# Patient Record
Sex: Female | Born: 1980 | Race: White | Hispanic: No | Marital: Married | Smoking: Former smoker
Health system: Southern US, Community
[De-identification: ages and names within clinical notes are randomized; demographics above are authoritative.]

---
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
## 2002-03-02 ENCOUNTER — Other Ambulatory Visit: Admission: RE | Admit: 2002-03-02 | Discharge: 2002-03-02 | Payer: Self-pay | Admitting: Obstetrics & Gynecology

## 2003-10-03 ENCOUNTER — Ambulatory Visit (HOSPITAL_COMMUNITY): Admission: RE | Admit: 2003-10-03 | Discharge: 2003-10-03 | Payer: Self-pay | Admitting: Obstetrics and Gynecology

## 2003-10-29 ENCOUNTER — Other Ambulatory Visit: Admission: RE | Admit: 2003-10-29 | Discharge: 2003-10-29 | Payer: Self-pay | Admitting: Obstetrics and Gynecology

## 2004-04-20 ENCOUNTER — Inpatient Hospital Stay (HOSPITAL_COMMUNITY): Admission: AD | Admit: 2004-04-20 | Discharge: 2004-04-20 | Payer: Self-pay | Admitting: Obstetrics and Gynecology

## 2004-04-26 ENCOUNTER — Inpatient Hospital Stay (HOSPITAL_COMMUNITY): Admission: AD | Admit: 2004-04-26 | Discharge: 2004-04-26 | Payer: Self-pay | Admitting: Obstetrics and Gynecology

## 2004-04-28 ENCOUNTER — Inpatient Hospital Stay (HOSPITAL_COMMUNITY): Admission: AD | Admit: 2004-04-28 | Discharge: 2004-04-28 | Payer: Self-pay | Admitting: Obstetrics and Gynecology

## 2004-05-19 ENCOUNTER — Inpatient Hospital Stay (HOSPITAL_COMMUNITY): Admission: AD | Admit: 2004-05-19 | Discharge: 2004-05-21 | Payer: Self-pay | Admitting: Obstetrics and Gynecology

## 2004-10-21 ENCOUNTER — Emergency Department (HOSPITAL_COMMUNITY): Admission: EM | Admit: 2004-10-21 | Discharge: 2004-10-21 | Payer: Self-pay | Admitting: Emergency Medicine

## 2005-02-24 ENCOUNTER — Other Ambulatory Visit: Admission: RE | Admit: 2005-02-24 | Discharge: 2005-02-24 | Payer: Self-pay | Admitting: Obstetrics and Gynecology

## 2005-06-29 ENCOUNTER — Inpatient Hospital Stay (HOSPITAL_COMMUNITY): Admission: AD | Admit: 2005-06-29 | Discharge: 2005-06-29 | Payer: Self-pay | Admitting: Obstetrics and Gynecology

## 2005-07-08 ENCOUNTER — Inpatient Hospital Stay (HOSPITAL_COMMUNITY): Admission: AD | Admit: 2005-07-08 | Discharge: 2005-07-08 | Payer: Self-pay | Admitting: Obstetrics and Gynecology

## 2005-07-08 ENCOUNTER — Inpatient Hospital Stay (HOSPITAL_COMMUNITY): Admission: AD | Admit: 2005-07-08 | Discharge: 2005-07-10 | Payer: Self-pay | Admitting: Obstetrics and Gynecology

## 2005-07-08 ENCOUNTER — Encounter (INDEPENDENT_AMBULATORY_CARE_PROVIDER_SITE_OTHER): Payer: Self-pay | Admitting: Specialist

## 2005-09-04 IMAGING — US US OB TRANSVAGINAL MODIFY
1 series · 18 of 21 positions shown · non-contrast
Comparison: none

CLINICAL DATA: Question blighted ovum.
EARLY OBSTETRICAL ULTRASOUND WITH TRANSVAGINAL
Multiple images of the uterus and adnexa were obtained using a transabdominal and endovaginal approaches. 
There is a single intrauterine pregnancy identified that demonstrates an estimated gestational age by crown rump length of 7 weeks and 5 days.  Positive regular fetal cardiac activity with a rate of 154 bpm was noted.  A normal appearing yolk sac is seen.  No evidence for subchorionic hemorrhage is noted.
There is a simple cyst identified in the right adnexa which measures 5.8 x 4.3 x 3.5 cm.  This likely represents an enlarged corpus luteum cyst, but follow-up evaluation at a later point in gestation would be recommended to assess for interval decrease in size or resolution.  While this is felt most likely to be ovarian clearly defined ovarian tissue surrounding this cyst cannot be identified.  No separate right ovary is seen and the left ovary was not visualized either transabdominally or endovaginally.  No pelvic fluid is seen.
IMPRESSION
7 week 5 day living intrauterine pregnancy.  Right adnexal simple cyst felt likely to an prominent corpus luteum cyst.  Follow-up at a later point in gestation is recommended for initial short term reassessment.

[Series 1: us ob transvaginal · 18 of 21 slices shown]
[im 1/21]
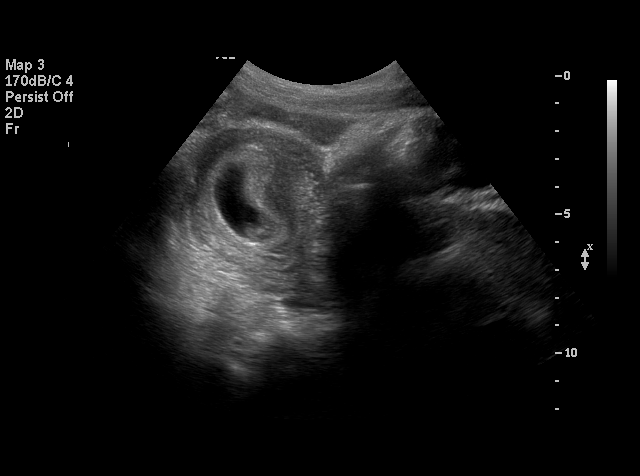
[im 2/21]
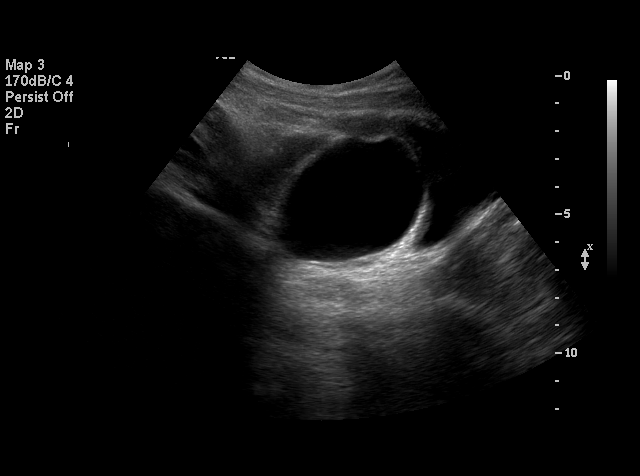
[im 3/21]
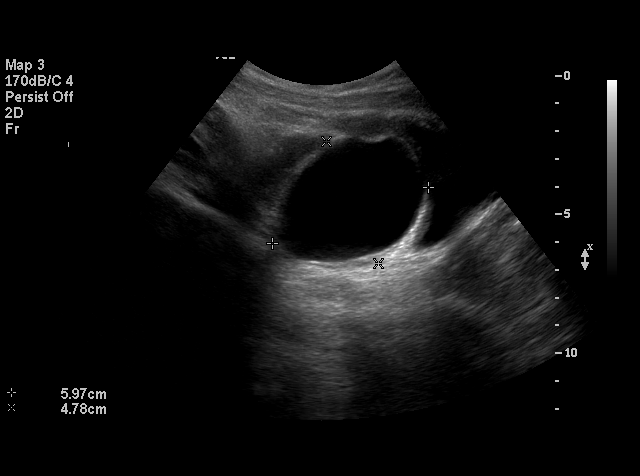
[im 5/21]
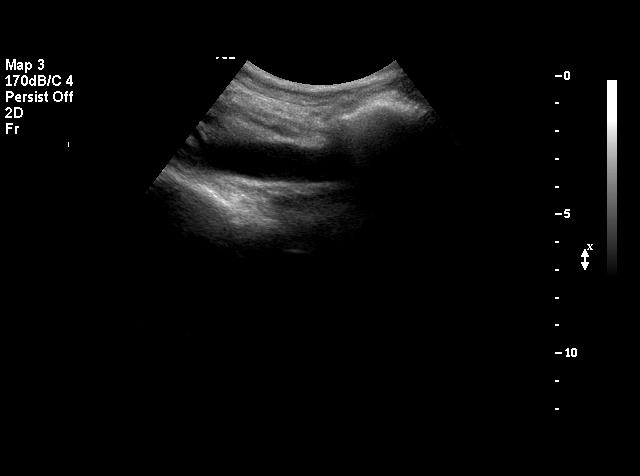
[im 6/21]
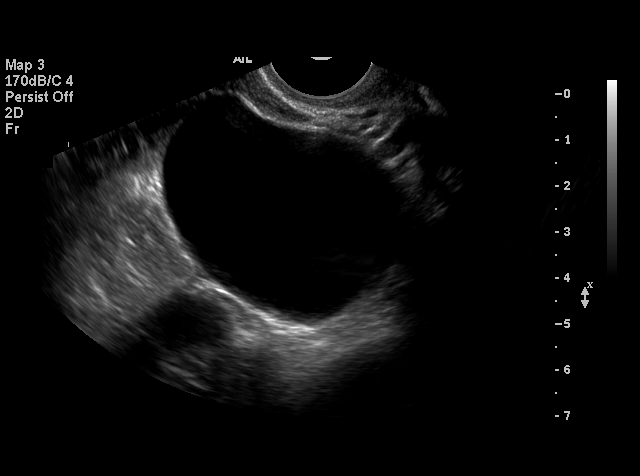
[im 7/21]
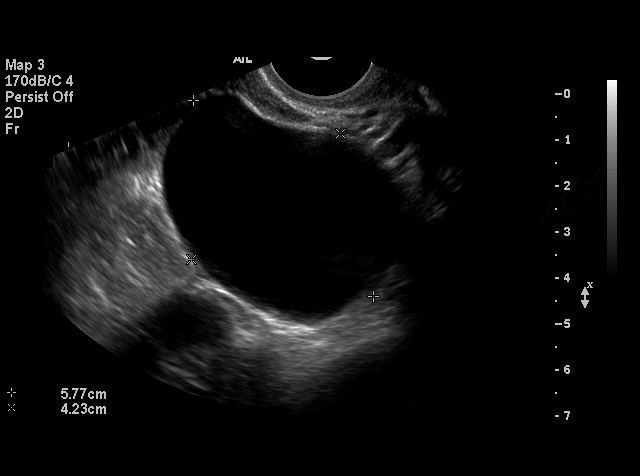
[im 8/21]
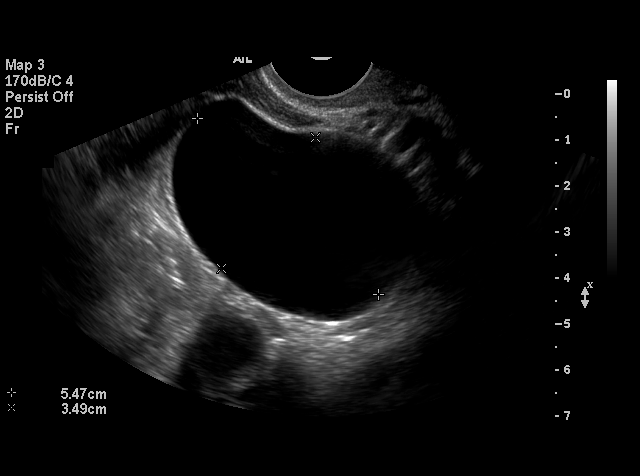
[im 9/21]
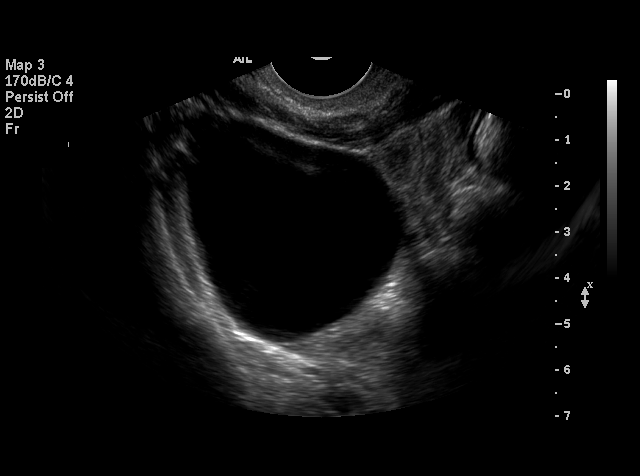
[im 10/21]
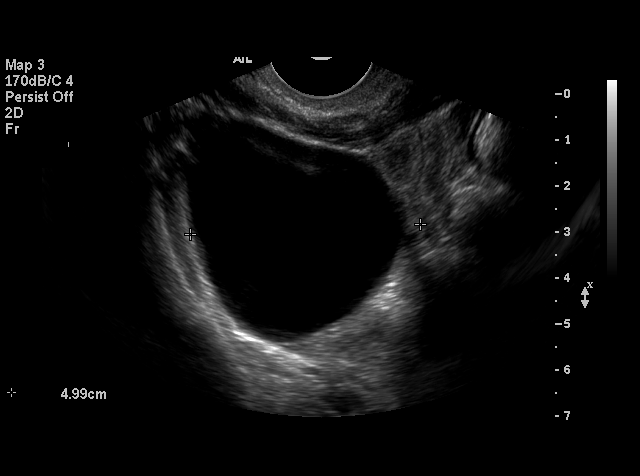
[im 12/21]
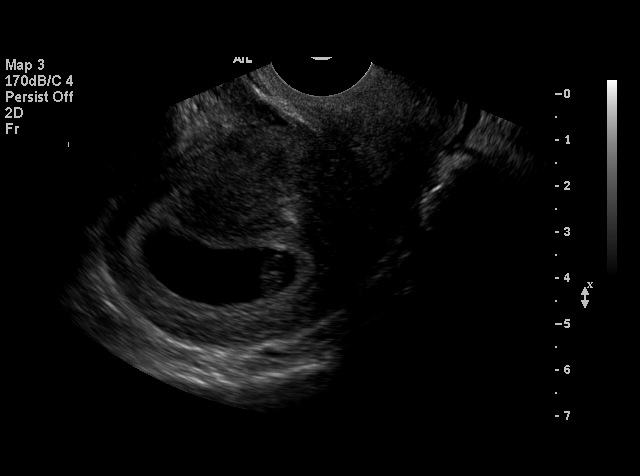
[im 13/21]
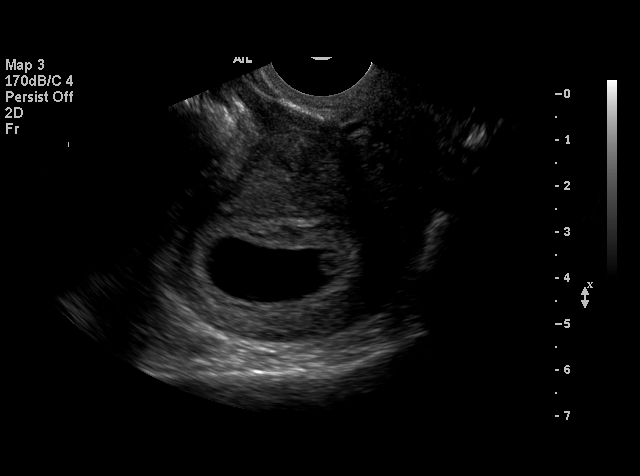
[im 14/21]
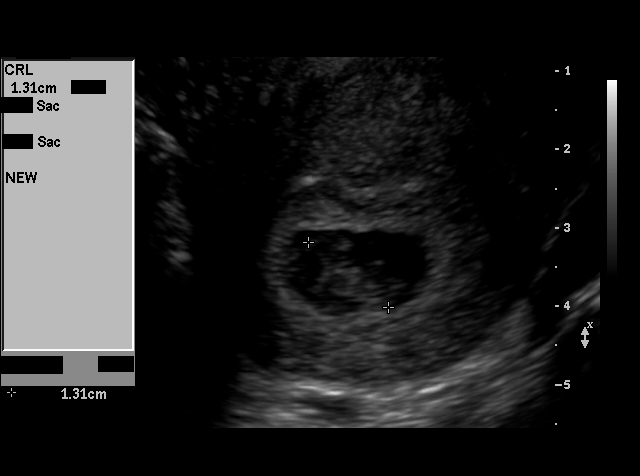
[im 15/21]
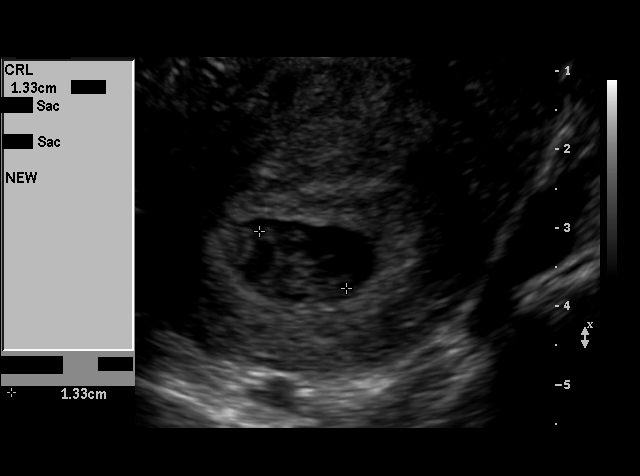
[im 16/21]
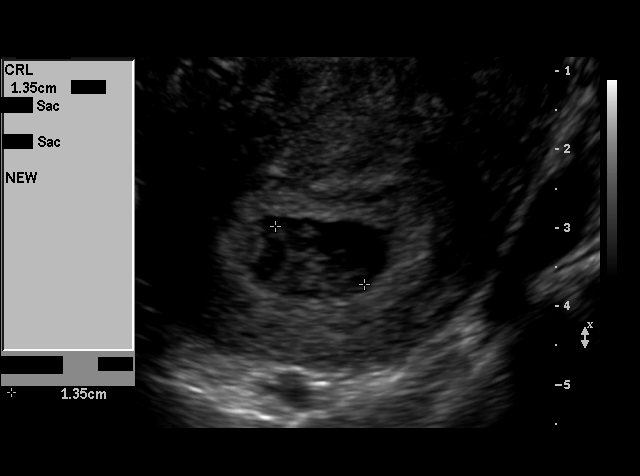
[im 17/21]
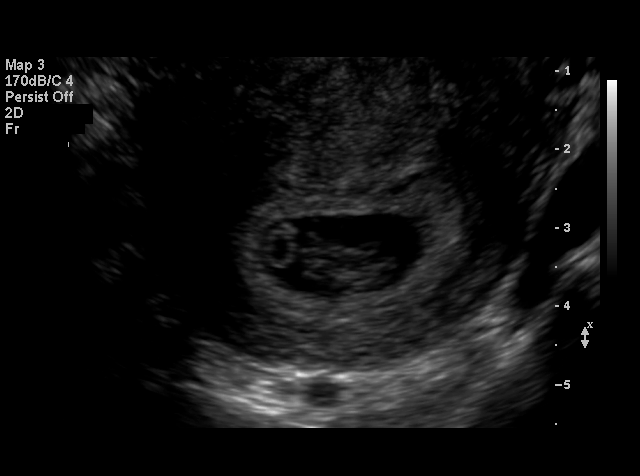
[im 19/21]
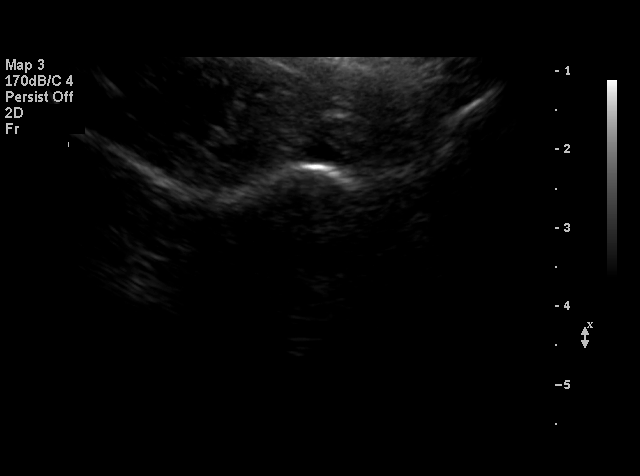
[im 20/21]
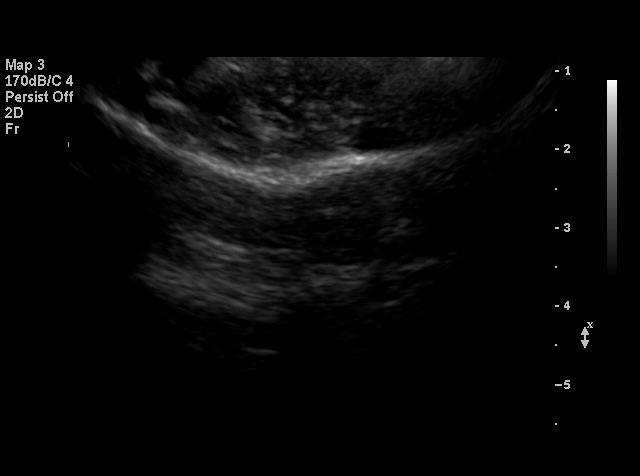
[im 21/21]
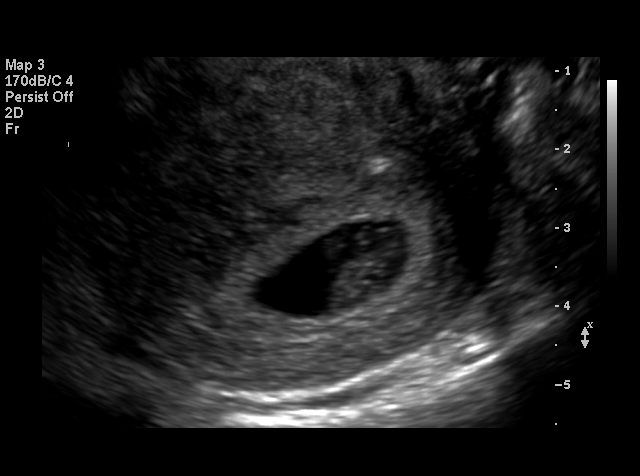

[18 of 21 positions shown; findings below may reference images not displayed]

## 2007-12-04 ENCOUNTER — Inpatient Hospital Stay (HOSPITAL_COMMUNITY): Admission: AD | Admit: 2007-12-04 | Discharge: 2007-12-05 | Payer: Self-pay | Admitting: Obstetrics and Gynecology

## 2007-12-05 ENCOUNTER — Inpatient Hospital Stay (HOSPITAL_COMMUNITY): Admission: AD | Admit: 2007-12-05 | Discharge: 2007-12-14 | Payer: Self-pay | Admitting: Obstetrics and Gynecology

## 2007-12-12 ENCOUNTER — Encounter (INDEPENDENT_AMBULATORY_CARE_PROVIDER_SITE_OTHER): Payer: Self-pay | Admitting: Obstetrics and Gynecology

## 2007-12-13 ENCOUNTER — Encounter (INDEPENDENT_AMBULATORY_CARE_PROVIDER_SITE_OTHER): Payer: Self-pay | Admitting: Obstetrics and Gynecology

## 2009-11-07 IMAGING — US US FETAL BPP W/O NONSTRESS
1 series · 14 of 20 positions shown · non-contrast
Comparison: none

OBSTETRICAL ULTRASOUND:
 This ultrasound exam was performed in the [HOSPITAL] Ultrasound Department.  The OB US report was generated in the [facility] system, and faxed to the ordering physician.  This report is also available in [REDACTED] PACS.

[Series 1: us fetal bpp w/o nonstress · 0.28mm/px · 14 of 20 slices shown]
[im 1/20]
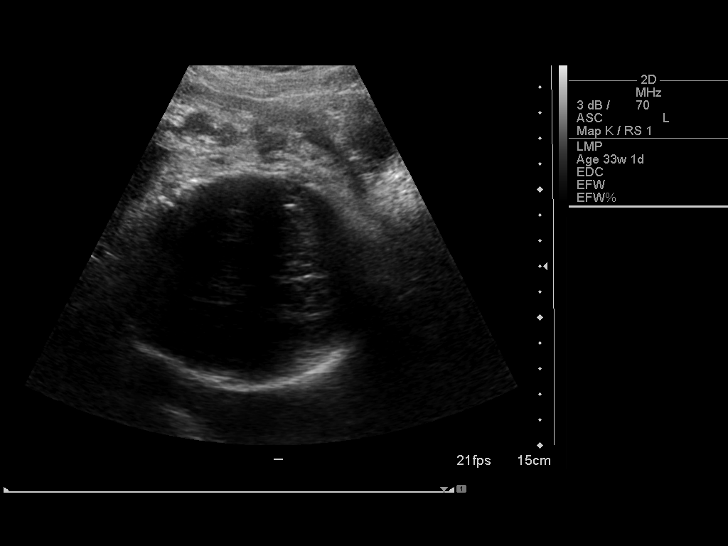
[im 3/20]
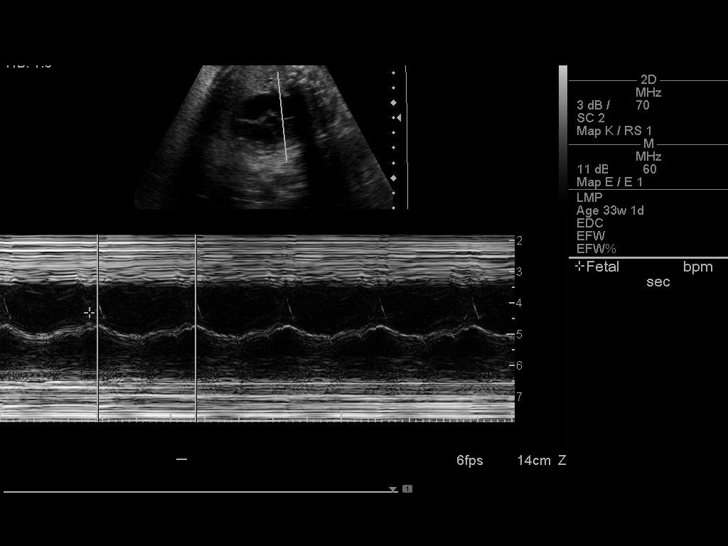
[im 4/20]
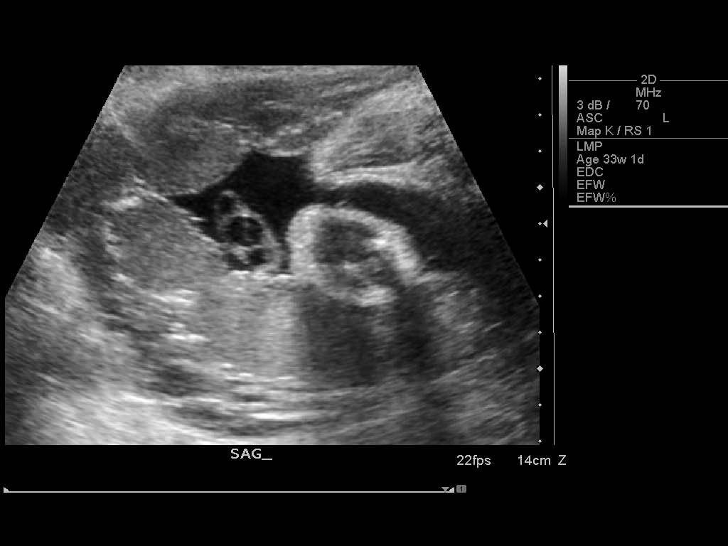
[im 6/20]
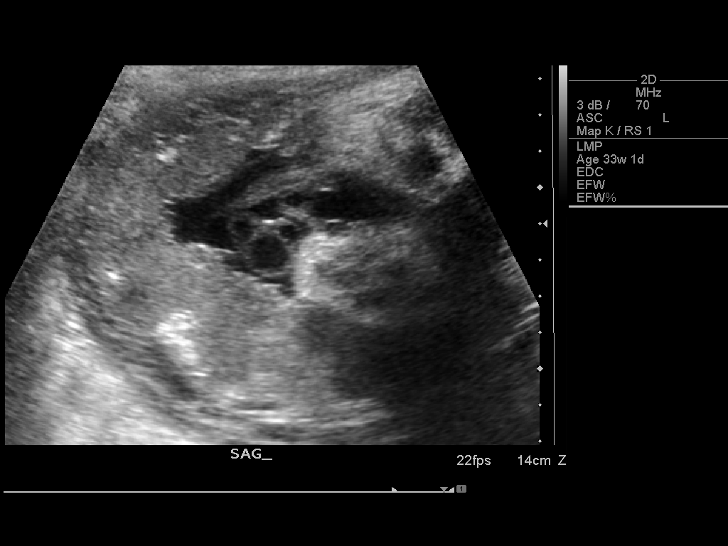
[im 7/20]
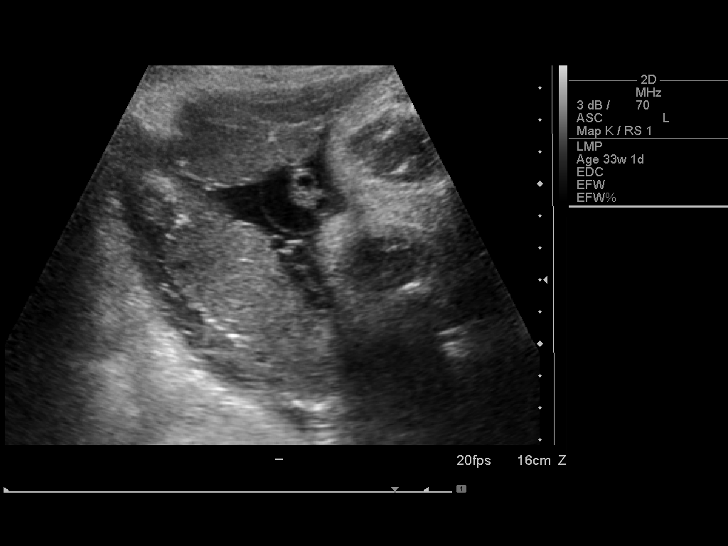
[im 8/20]
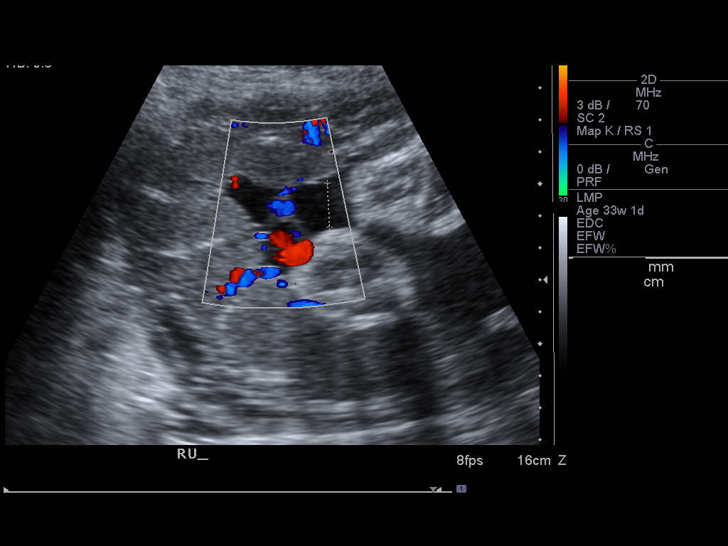
[im 10/20]
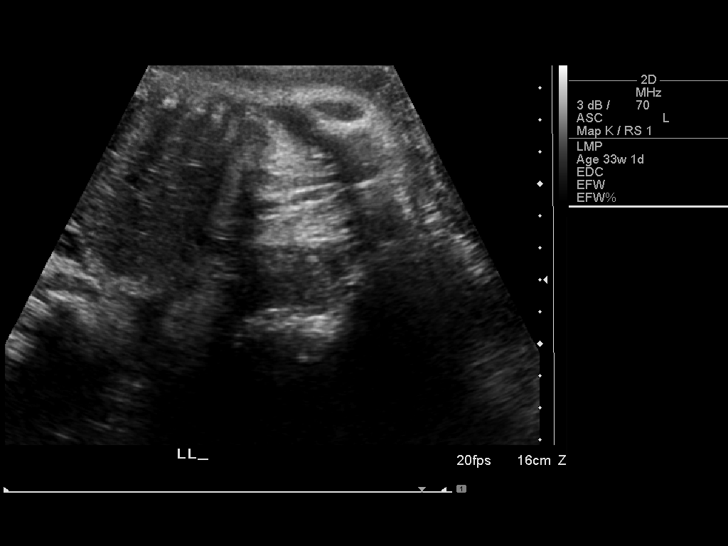
[im 11/20]
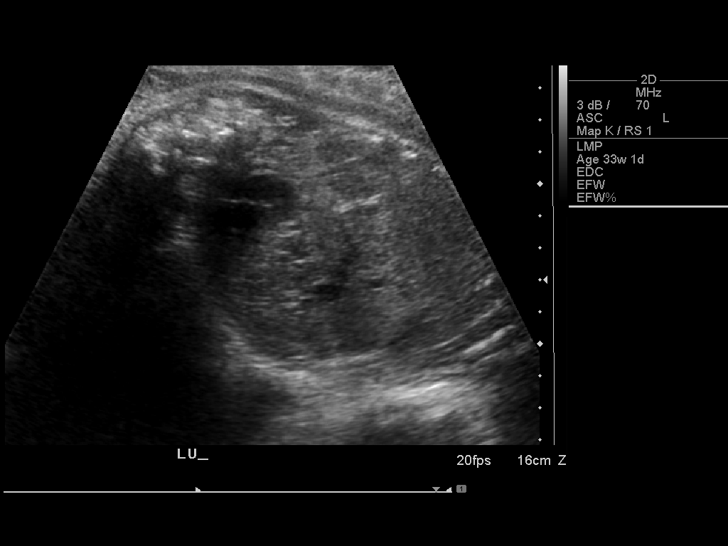
[im 13/20]
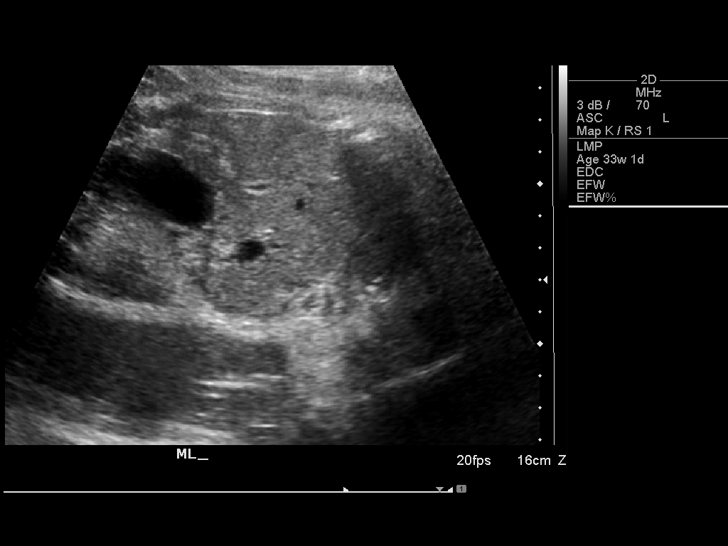
[im 14/20]
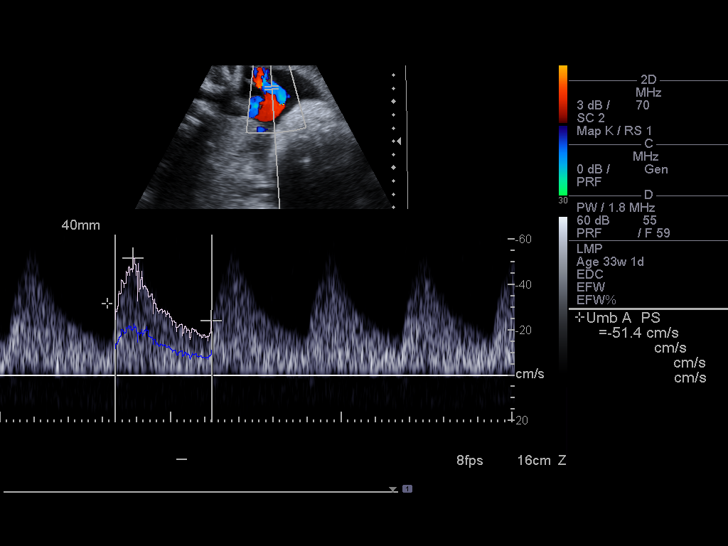
[im 16/20]
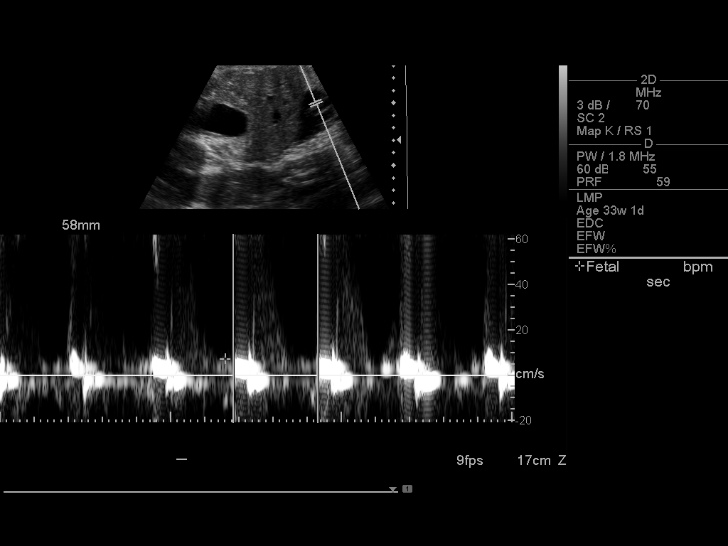
[im 17/20]
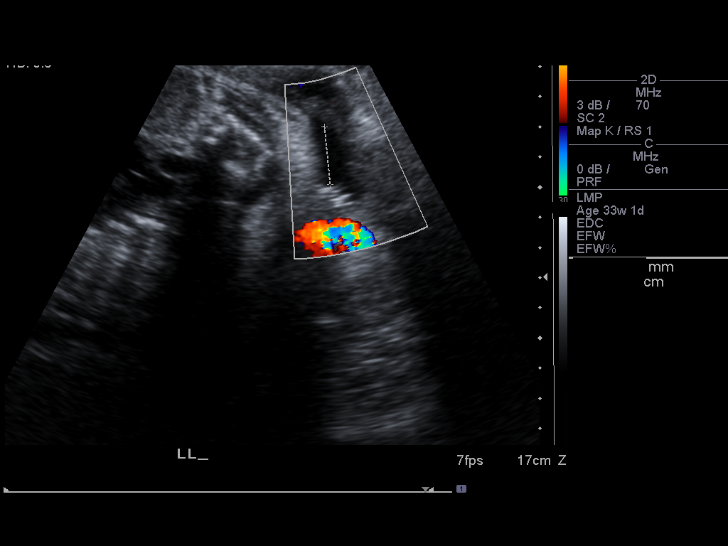
[im 18/20]
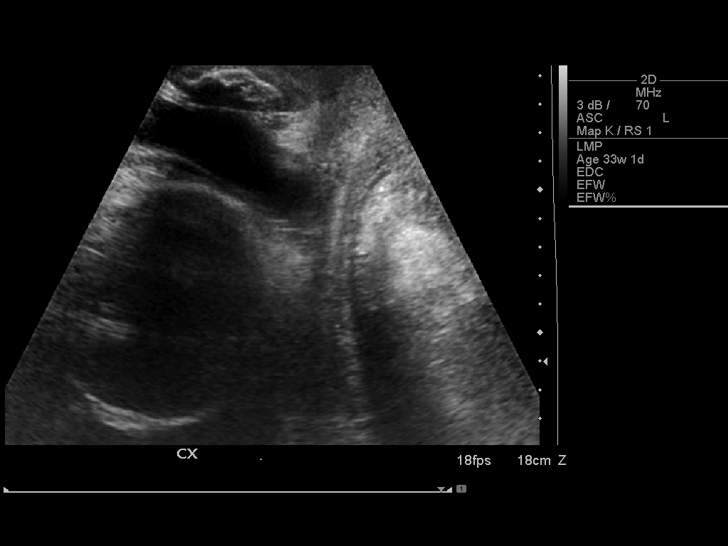
[im 20/20]
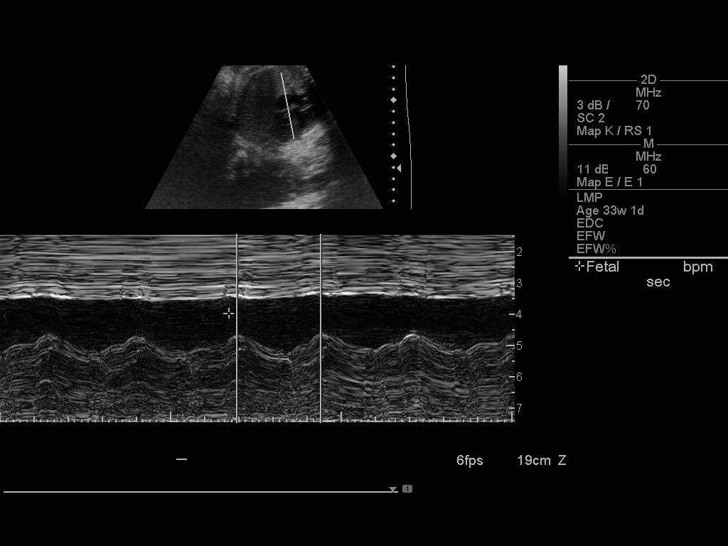

[14 of 20 positions shown; findings below may reference images not displayed]

IMPRESSION: See [facility] Obstetric US report.

## 2010-08-23 ENCOUNTER — Encounter: Payer: Self-pay | Admitting: Obstetrics and Gynecology

## 2010-12-15 NOTE — Op Note (Signed)
[name], [name] NO.:  [id number]   MEDICAL RECORD NO.:  [id number]           PATIENT TYPE:   LOCATION:                                 FACILITY:   PHYSICIAN:  Huel Cote, M.D. DATE OF BIRTH:  [birth year]   DATE OF PROCEDURE:  12/13/2007  DATE OF DISCHARGE:                               OPERATIVE REPORT   PREOPERATIVE DIAGNOSES:  1. Desires permanent sterilization.  2. Status post normal spontaneous vaginal delivery at 34 weeks.   POSTOPERATIVE DIAGNOSES:  1. Desires permanent sterilization.  2. Status post normal spontaneous vaginal delivery at 34 weeks.   PROCEDURE:  Postpartum tubal ligation.   SURGEON:  Huel Cote, MD   ANESTHESIA:  Epidural.   FINDINGS:  She had normal uterus, tubes, and ovaries noted.   ESTIMATED BLOOD LOSS:  Minimal.   DESCRIPTION OF PROCEDURE:  The patient was taken to the operating room  where epidural anesthesia was found to be adequate by Allis clamp test.  She was then prepped and draped in normal sterile fashion in the dorsal  supine position.  Marcaine 0.25% injection was performed at the  infraumbilical area and the umbilicus was incised in a transverse  fashion with the scalpel, approximately 2-3 cm incision.  This was  carried through to the underlying layer of fascia with Mayo scissors and  at the fascial level, the fascia was opened carefully with Mayo  scissors.  The peritoneal cavity itself was entered bluntly.  There was  a very small umbilical hernia of just subcutaneous fat noted through the  superior portion of the incision just adjacent to the umbilicus.  This  fatty tissue was reduced and the hernial defect was closed at the end of  the procedure.  When the peritoneal cavity was opened, the [employer]  retractors were placed within and the left fallopian tube identified,  grasped with Babcock clamp, and traced out to its fimbriated end.  It  was then elevated in the 2-3 cm segment and 2 free  ties of 0 plain  passed around that segment.  The tube was then amputated and the free  pedicles were additionally cauterized with a Bovie cautery.  The tube  was then returned to the abdomen when it was found to be hemostatic.  In  the similar fashion, the right fallopian tube was identified, traced out  to its fimbriated end and a 2-3 cm segment removed with 2 free ties of 0  plain passed around it.  It also was additionally cauterized at the free  pedicles.  This was then returned to the abdomen as well.  All  instruments and sponges were removed from the abdomen and the fascia was  then closed with 0 Vicryl in a  running fashion.  The very small hernia was also closed with 0 Vicryl  and a good closure noted.  The skin was then closed with 3-0 Vicryl in a  subcuticular stitch.  Sponge, lap, and needle counts were correct x2 and  the patient was taken to the recovery room in stable condition.      Huel Cote, M.D.  Electronically Signed     KR/MEDQ  D:  12/13/2007  T:  12/14/2007  Job:  [id number]

## 2010-12-15 NOTE — Discharge Summary (Signed)
[name], [name]             ACCOUNT NO.:  [id number]   MEDICAL RECORD NO.:  [id number]          PATIENT TYPE:  INP   LOCATION:  9305                          FACILITY:  [facility]   PHYSICIAN:  Huel Cote, M.D. DATE OF BIRTH:  [birth year]   DATE OF ADMISSION:  12/05/2007  DATE OF DISCHARGE:  12/14/2007                               DISCHARGE SUMMARY   DISCHARGE DIAGNOSES:  1. Preterm pregnancy at 33 weeks with premature rupture of membranes      until 34 weeks with a normal spontaneous vaginal delivery by      induction.  2. Status post postpartum tubal ligation.   DISCHARGE MEDICATIONS:  1. Motrin 600 mg p.o. every 6 hours.  2. Percocet 1-2 tablets p.o. every 4 hours p.r.n.   DISCHARGE FOLLOW-UP:  The patient will follow up in the office in 2  weeks for an incision check and again in 6 weeks for her full postpartum  exam.   HOSPITAL COURSE:  The patient is 30 year old G3, P2-0-0-2, who came in  for a complaint of rupture of membranes which was confirmed.  Her  estimated due date was contrived by an LMP of unsure nature and an  ultrasound that was late at approximately 18 weeks with a differing  gestational age by 1 week by her periods.  The patient's gestational age  would be 33 weeks and by the ultrasound 34 weeks.  Therefore, it was  thought that it would be best to be in the conservative nature and  consider the patient's 33 weeks' gestation.  She did have a pool  collected from her leakage of fluid which was sent and revealed  nonmature lungs and therefore received betamethasone x2.  Her prenatal  course up to that point had been recently uncomplicated.  She was a  smoker of half pack a day.  She had had a prior C-section emergently for  a prolapsed cord, which was identified on presentation to the office and  that baby ultimately did well.  This was at 36 weeks and therefore was  back currently.  Prior to that, she has had a normal vaginal delivery.  There were no  other well known issues with this pregnancy other than  starting her prenatal care slightly late at 18-20 weeks' gestation.  Her  prenatal labs were as follows; A+ antibody negative, RPR nonreactive,  rubella immune, hepatitis B surface antigen negative, HIV negative, GC  negative, chlamydia negative.  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  Her group B  strep status was negative at one point; however, closer to the delivery,  she was treated as an unknown given her preterm status.  1-hour Glucola  was normal.  She was too late for genetic screening.  Her past medical  history none significant.  Past surgical history in 2006 showed a low  transverse C-section emergently for cord prolapse.  Past obstetrical  history in October 2005, she had a 6 pounds 15 ounces infant vaginally.  In December 2006, she had be 5 pounds 14 ounces female  infant by C-  section with cord prolapse.  On admission, she was afebrile with stable  vital signs.  There was no significant white count or signs and symptoms  of chorea; therefore, she was admitted to conservative management.  She  was placed on ampicillin and erythromycin IV and again had fluid  collected that was sent for fetal lung maturity.  This did return  negative.  She was then kept in health from 33 weeks to 34 weeks,  managed on bedrest and IV antibiotics.  On her approaching 34 weeks, the  decision was made to proceed with induction of labor.  She had received  her betamethasone x2 and she was placed on IV Pitocin and responded  well.  She went in the labor, received an epidural, and reached complete  dilation and pushed great with a normal spontaneous vaginal delivery of  a vigorous female infant over an intact perineum.  Weight was 5 pounds 1  ounce.  Apgars were 9 and 9.  She was assessed by the NICU team and was  taken to the NICU for her early gestational age.  She had no other   complications and had decided prior to delivery that she wishes to  proceed with a postpartum tubal ligation.  We discussed this carefully.  The risks, benefits, and the risk of failure were approximately 1 in 100  and the patient expressed desire to continue with surgery.  Therefore on  postpartum day #1, she underwent a postpartum tubal sterilization  without difficulty.  She was then admitted again to her postpartum room  overnight, at which point, she did quite well.  Her discharge hemoglobin  was 9.3, and she was doing quite well, tolerating her pain with Motrin  only.  She was discharged home with Motrin and a Percocet prescription  just in case she needed it and the baby was doing fine in the NICU  probably to be discharged within 48 hours.  The patient was counseled to  come in in 2 weeks for an incision check and will do so in our office.      Huel Cote, M.D.  Electronically Signed     KR/MEDQ  D:  12/14/2007  T:  12/14/2007  Job:  [id number]

## 2010-12-18 NOTE — Discharge Summary (Signed)
NAMEAMILLIA, [name]             ACCOUNT NO.:  [id number]   MEDICAL RECORD NO.:  [id number]          PATIENT TYPE:  INP   LOCATION:  9107                          FACILITY:  [facility]   PHYSICIAN:  Zenaida Niece, M.D.DATE OF BIRTH:  [birth year]   DATE OF ADMISSION:  05/19/2004  DATE OF DISCHARGE:  05/21/2004                                 DISCHARGE SUMMARY   ADMISSION DIAGNOSES:  Intrauterine pregnancy at 40 weeks.   DISCHARGE DIAGNOSES:  Intrauterine pregnancy at 40 weeks.   PROCEDURE:  On October 18, she had a spontaneous vaginal delivery.   HISTORY:  This is a 30 year old white female, gravida 1, para 0 with an EGA  of [redacted] weeks who presents with the complaint of possible leaking fluid with  fairly regular contractions, no bleeding, good fetal movement. Evaluation in  triage revealed irregular contractions and no evidence of ruptured membranes  but she did have persistent mild variables on fetal heart tracing.  The  cervix was favorable so she was admitted for induction.  Prenatal care  complicated by positive chlamydia treated with Zithromax, a right ovarian  cyst followed by ultrasound and asymptomatic gallstones.   PRENATAL LABS:  Blood type is A positive with a negative antibody screen,  RPR nonreactive.  Hepatitis B surface antigen negative, HIV negative,  gonorrhea was negative, chlamydia was positive, triple screen normal. Group  B strep is negative. Rubella is immune.   PAST SURGICAL HISTORY:  Essentially noncontributory.   PHYSICAL EXAMINATION:  VITAL SIGNS:  She was afebrile with stable vital  signs. Fetal heart tracing reassuring with mild variables and occasional  contractions.  ABDOMEN:  Gravid, nontender with an estimated fetal weight of 7.5 pounds.  The cervix was 4, 90, 0, vertex presentation, adequate pelvis and amniotomy  revealed clear fluid.   HOSPITAL COURSE:  The patient was admitted for induction and had amniotomy  performed and was started on  Pitocin.  She progressed into active labor and  received an epidural. She progressed to complete and pushed well on the  evening of October 18 and had a vaginal delivery of a viable female infant  with Apgar's of 9 & 9 that weighed 6 pounds and 15 ounces. The baby was in  the ROP position and delivered through a loose nuchal cord.  The placenta  delivered spontaneous and was intact. She had bilateral labial lacerations  repaired with 3-0 Vicryl with an estimated blood loss of 500 mL.  Postpartum  she had no significant complications. On the evening of October 19, she did  fall on her bottom and had some discomfort in her coccyx but was able to  tolerate this. Predelivery hemoglobin 10.9, post delivery 9.2.  On  postpartum day #2, she was stable for discharge home.   DISCHARGE INSTRUCTIONS:  Regular diet, pelvic rest, followup in six weeks.   DISCHARGE MEDICATIONS:  1.  Percocet #30, 1-2 p.o. q.4-6 h. p.r.n. pain.  2.  Over the counter ibuprofen as needed.   She is given our discharge pamphlet.     Todd   TDM/MEDQ  D:  05/21/2004  T:  05/21/2004  Job:  [id number]

## 2010-12-18 NOTE — Discharge Summary (Signed)
NAMEVALERIA, [name]             ACCOUNT NO.:  [id number]   MEDICAL RECORD NO.:  [id number]          PATIENT TYPE:  INP   LOCATION:  9122                          FACILITY:  [facility]   PHYSICIAN:  Zenaida Niece, M.D.DATE OF BIRTH:  12/11/1980   DATE OF ADMISSION:  07/08/2005  DATE OF DISCHARGE:  07/10/2005                                 DISCHARGE SUMMARY   ADMISSION DIAGNOSIS:  Intrauterine pregnancy at 36+ weeks, spontaneous  rupture of membranes, transverse lie and cord prolapse.   DISCHARGE DIAGNOSIS:  Intrauterine pregnancy at 36+ weeks, spontaneous  rupture of membranes, transverse lie and cord prolapse. Right paratubal  cyst.   PROCEDURES:  On December 7, she had a primary low transverse cesarean  section and removal of a right paratubal cyst.   HISTORY AND PHYSICAL:  This is a 30 year old white female, gravida 2, para 1-  0-0-1 with an EGA of 36+ weeks who was seen on the morning of December 7 for  an external cephalic version. Baby was breech and was fairly easily  converted to a vertex presentation. She then came to the office that  afternoon with complaint of rupture of membranes. On exam in the office, she  was found to have cord prolapse with cord present in the cervix with good  fetal heart rate. Ultrasound in the office revealed breech presentation.  Prenatal care complicated only by breech presentation and the fact that she  is a cystic fibrosis carrier and that she smoked during pregnancy.   PRENATAL LABORATORY DATA:  Blood type is A+ with a negative antibody screen,  rubella immune, RPR nonreactive, HIV negative, hepatitis B surface antigen  negative, gonorrhea and chlamydia negative, group B strep is negative and 1-  hour Glucola is 102.   PAST OBSTETRICAL HISTORY:  In 2005, vaginal delivery at term without  complications. The remainder of her history is noncontributory.   PHYSICAL EXAMINATION:  GENERAL:  She is afebrile with stable vital signs.  Fetal  heart tracing is reassuring.  ABDOMEN:  Gravid, nontender.  CERVIX:  Cervix is 2-3 cm dilated, 75% effaced and umbilical cord at the  cervical os with palpation.   HOSPITAL COURSE:  The patient was brought from the office and then taken  directly to the operating room where Dr. Senaida Ores performed cesarean  section and removal of a paratubal cyst. Delivered a viable female infant  with Apgars of 7 and 9 that 5 pounds 14 ounces. Transverse lie and cord  prolapse were noted. Cord pH was 7.30, and she was also noted to have 4-cm  right paratubal cyst. Postoperatively, she had no significant complications.  Predelivery hemoglobin 9.5, postdelivery 8.8. On postoperative #2, she  requested discharge home. At that time, her abdomen was benign, and her  incision was healing well, and she was felt to be stable enough for  discharge home. Prior to discharge, the nurse was to remove her staples and  apply Steri-Strips.   DISCHARGE INSTRUCTIONS:  Regular diet, pelvic rest, no strenuous activity.  Follow-up is in approximately two weeks for an incision check. Medications  are over-the-counter ibuprofen as needed,  and she is given our discharge  pamphlet.      Zenaida Niece, M.D.  Electronically Signed     TDM/MEDQ  D:  07/10/2005  T:  07/10/2005  Job:  [id number]

## 2010-12-18 NOTE — Op Note (Signed)
NAMEQUERIDA, [name]             ACCOUNT NO.:  [id number]   MEDICAL RECORD NO.:  [id number]          PATIENT TYPE:  INP   LOCATION:  9122                          FACILITY:  [facility]   PHYSICIAN:  Huel Cote, M.D. DATE OF BIRTH:  Jun 30, 1981   DATE OF PROCEDURE:  07/08/2005  DATE OF DISCHARGE:                                 OPERATIVE REPORT   PREOPERATIVE DIAGNOSES:  1.  Preterm pregnancy at 36+ weeks, delivered.  2.  Spontaneous rupture of membranes.  3.  Cord prolapse.  4.  Transverse lie.   POSTOPERATIVE DIAGNOSES:  1.  Preterm pregnancy at 36+ weeks, delivered.  2.  Spontaneous rupture of membranes.  3.  Cord prolapse.  4.  Transverse lie.  5.  Right paratubal cyst.   PROCEDURES:  1.  Primary low transverse cesarean section via Pfannenstiel incision with a      double-layered closure of the uterus.  2.  Right paratubal cystectomy.   SURGEON:  Huel Cote, M.D.   ANESTHESIA:  Spinal.   SPECIMEN:  A right paratubal cyst.   ESTIMATED BLOOD LOSS:  700 mL.   FINDINGS:  There was a viable female infant in a transverse lie, cord  prolapse was noted  Apgars were 7 and 9.  Cord pH was 7.30.  Weight was 5  pounds 12 ounces.  There was a 4 cm right paratubal cyst which was noted and  removed with a cystectomy at the time of C-section.  Estimated blood loss  700 mL. Urine output approximately 100 mL clear urine.  IV fluids 1400 mL  LR.   PROCEDURE:  The patient was brought emergently via [facility] to [facility] from our office given the finding of a cord prolapse on exam after  spontaneous rupture of membranes.  She had continuous fetal monitoring for  the transfer, and the heart rate was stable.  She was brought directly to  the operating room, where spinal anesthesia was obtained without difficulty  with the fetal heart rate being continuously monitored and was prepped and  draped in the normal sterile fashion in the dorsal supine position with a  leftward  tilt.  A Pfannenstiel skin incision was then made and carried  through to underlying layer of fascia by sharp dissection.  The fascia was  then nicked in the midline and the incision was extended laterally with Mayo  scissors.  The inferior aspect of the incision was grasped with Kocher  clamps, elevated and dissected off the rectus muscles.  In a similar  fashion, the superior aspect was dissected off the rectus muscles.  The  rectus muscles were separated in the middle and the peritoneal cavity  entered bluntly.  The peritoneal incision was then extended bluntly and the  bladder blade inserted.  The uterus was then incised in a transverse fashion  and the uterine cavity itself entered bluntly.  The cord was encountered  immediately upon opening the uterine incision, and there was no presenting  part of the lower segment.  The infant was found to be in a transverse lie  with the back up.  The  infant's feet were then grasped and delivered through  the incision and then delivered in a breech presentation with the head  flexed.  The cord was clamped and cut and the infant handed immediately to  the awaiting pediatricians.  Cord pH and cord blood was then obtained and  the placenta was delivered from the uterus manually.  The uterus was cleared  of all clots and debris with a moistened lap sponge.  The uterus was then  closed with 0 chromic in two layers, the first a running locked layer, the  second an imbricating layer of the same.  At this point there was good  hemostasis noted.  The ovaries and tubes were inspected and there was a  large 4-cm paratubal cyst identified on the right.  This was opened with  Bovie cautery and the majority of the cyst wall was removed with Bovie  cautery as well.  It was just adjacent longitudinally to the tube itself.  Therefore, a small portion of the cyst wall was left along the fimbriated  end of the tube to avoid damage to the tube itself.  This was  hemostatic.  The incision was once again inspected and found be hemostatic.  There was no  active bleeding noted.  Therefore, all instruments and sponges were removed  from the patient's abdomen.  Her fascia was closed with 0 Vicryl in running  fashion and the skin was closed with staples.  Sponge, lap and needle counts  were correct x2 and the patient was taken to the recovery room in stable  condition.      Huel Cote, M.D.  Electronically Signed     KR/MEDQ  D:  07/08/2005  T:  07/08/2005  Job:  [id number]

## 2018-03-07 ENCOUNTER — Emergency Department (HOSPITAL_COMMUNITY)
Admission: EM | Admit: 2018-03-07 | Discharge: 2018-03-07 | Discharge status: Against Medical Advice | Payer: Medicaid Other | Attending: Emergency Medicine | Admitting: Emergency Medicine

## 2018-03-07 ENCOUNTER — Other Ambulatory Visit: Payer: Self-pay

## 2018-03-07 ENCOUNTER — Encounter (HOSPITAL_COMMUNITY): Payer: Self-pay

## 2018-03-07 ENCOUNTER — Emergency Department (HOSPITAL_COMMUNITY): Payer: Medicaid Other

## 2018-03-07 DIAGNOSIS — F17220 Nicotine dependence, chewing tobacco, uncomplicated: Secondary | ICD-10-CM | POA: Insufficient documentation

## 2018-03-07 DIAGNOSIS — Z5320 Procedure and treatment not carried out because of patient's decision for unspecified reasons: Secondary | ICD-10-CM | POA: Diagnosis not present

## 2018-03-07 DIAGNOSIS — R079 Chest pain, unspecified: Secondary | ICD-10-CM | POA: Insufficient documentation

## 2018-03-07 LAB — CBC
HEMATOCRIT: 37.8 % (ref 36.0–46.0)
Hemoglobin: 12.1 g/dL (ref 12.0–15.0)
MCH: 31.3 pg (ref 26.0–34.0)
MCHC: 32 g/dL (ref 30.0–36.0)
MCV: 97.7 fL (ref 78.0–100.0)
Platelets: 245 10*3/uL (ref 150–400)
RBC: 3.87 MIL/uL (ref 3.87–5.11)
RDW: 13.1 % (ref 11.5–15.5)
WBC: 7 10*3/uL (ref 4.0–10.5)

## 2018-03-07 LAB — I-STAT BETA HCG BLOOD, ED (MC, WL, AP ONLY)

## 2018-03-07 LAB — I-STAT TROPONIN, ED: Troponin i, poc: 0 ng/mL (ref 0.00–0.08)

## 2018-03-07 LAB — BASIC METABOLIC PANEL
Anion gap: 9 (ref 5–15)
BUN: 9 mg/dL (ref 6–20)
CO2: 26 mmol/L (ref 22–32)
Calcium: 9.4 mg/dL (ref 8.9–10.3)
Chloride: 105 mmol/L (ref 98–111)
Creatinine, Ser: 0.86 mg/dL (ref 0.44–1.00)
GFR calc Af Amer: >60 mL/min (ref >60)
GFR calc non Af Amer: >60 mL/min (ref >60)
Glucose, Bld: 91 mg/dL (ref 70–99)
POTASSIUM: 4.1 mmol/L (ref 3.5–5.1)
Sodium: 140 mmol/L (ref 135–145)

## 2018-03-07 NOTE — ED Provider Notes (Signed)
[facility] EMERGENCY DEPARTMENT Provider Note   CSN: [id number] Arrival date & time: 03/07/18  0308     History   Chief Complaint Chief Complaint  Patient presents with  . Chest Pain    HPI [name] is a 37 y.o. female with no significant past medical history who presents due to approximately 1 year of chest pain.  She says that she has intermittent episodes of left-sided chest pressure.  Her pain does not radiate.  It is not associated with exertion or relieved by rest.  She notices it mostly after eating and lying flat at night.  She thinks that it may be acid reflux.  Tums has not provided any relief.  She comes to the ER today because her episodes have been occurring more frequently.  They typically  occur a few times each month but have occurred over the past 3 nights.  She reports some nausea with the event yesterday, which is unusual.  She says that she normally feels short of breath with her episodes.  Her pain is not positional.  She says that pressing on her chest reproduces her pain.  She denies any history of VTE, recent travel, and recent surgery.  She has no known coronary artery disease.  She is unaware of any family history.  Chest Pain      History reviewed. No pertinent past medical history.  There are no active problems to display for this patient.   History reviewed. No pertinent surgical history.   OB History   None      Home Medications    Prior to Admission medications   Not on File    Family History No family history on file.  Social History Social History   Tobacco Use  . Smoking status: Former Games developer  . Smokeless tobacco: Current User  Substance Use Topics  . Alcohol use: Never    Frequency: Never  . Drug use: Never     Allergies   Penicillins   Review of Systems Review of Systems  Cardiovascular: Positive for chest pain.  Review of Systems   Constitutional  Negative for fever  Negative for chills   HENT  Negative for ear pain  Negative for sore throat  Negative for difficultly swallowing  Eyes  Negative for eye pain  Negative for visual disturbance  Respiratory  +for shortness of breath  Negative for cough  CV  +for chest pain  Negative for leg swelling  Abdomen  Negative for abdominal pain  +for nausea  Negative for vomiting  MSK  Negative for extremity pain  Negative for back pain  Skin  Negative for rash  Negative for wound  Neuro  Negative for syncope  Negative for difficultly speaking  Psych  Negative for confusion   The remainder of the ROS was reviewed and negative except as documented above.       Physical Exam Updated Vital Signs BP 126/81 (BP Location: Left Arm)   Pulse (!) 58   Temp 98.6 F (37 C) (Oral)   Resp 18   LMP 02/14/2018   SpO2 99%   Physical Exam Physical Exam Constitutional  Nursing notes reviewed  Vital signs reviewed  HEENT  No obvious trauma  Supple without meningismus, mass, or overt JVD  EOMI  No scleral icterus or injection  Respiratory  Effort normal  CTAB  No respiratory distress  CV  Normal rate  No obvious murmurs  No pitting edema  Equal pulses in  all extremities  Chest tender to palpation  Abdomen  Soft  Non-tender  Non-distended  No peritonitis  MSK  Atraumatic  No obvious deformity  ROM appropriate  Skin  Warm  Dry  Neuro  Awake and alert  EOMI  Moving all extremities  Denies numbness/tingling  Psychiatric  Mood and affect normal        ED Treatments / Results  Labs (all labs ordered are listed, but only abnormal results are displayed) Labs Reviewed  BASIC METABOLIC PANEL  CBC  TROPONIN I  I-STAT TROPONIN, ED  I-STAT BETA HCG BLOOD, ED ([facility], [facility], [facility] ONLY)    EKG EKG Interpretation  Date/Time:  Tuesday March 07 2018 03:13:58 EDT Ventricular Rate:  50 PR Interval:  132 QRS Duration: 110 QT Interval:  446 QTC Calculation: 406 R  Axis:   67 Text Interpretation:  Sinus bradycardia Incomplete right bundle branch block Borderline ECG No prior for comparison Confirmed by Ross Marcus ([id number]) on 03/07/2018 6:46:59 AM   Radiology Dg Chest 2 View  Result Date: 03/07/2018 CLINICAL DATA:  Lower chest pain and dyspnea x1 month.  Smoker. EXAM: CHEST - 2 VIEW COMPARISON:  None. FINDINGS: The heart size and mediastinal contours are within normal limits. Both lungs are clear. The visualized skeletal structures are unremarkable. IMPRESSION: No active cardiopulmonary disease. Electronically Signed   By: Tollie Eth M.D.   On: 03/07/2018 03:42    Procedures Procedures (including critical care time)  Medications Ordered in ED Medications - No data to display   Initial Impression / Assessment and Plan / ED Course  I have reviewed the triage vital signs and the nursing notes.  Pertinent labs & imaging results that were available during my care of the patient were reviewed by me and considered in my medical decision making (see chart for details).     [name] presents with chest pain as per above.  The ECG reveals no anatomical ischemia representing STEMI, New-Onset Arrhythmia, or ischemic equivalent. To further evaluate for ongoing myocardial ischemia, serial troponins will be ordered. The first of these is not elevated.  Repeat troponin at 3-hours was not obtained as she eloped.  The patient's presentation, the patient being hemodynamically stable, and the ECG are not consistent with Pericardial Tamponade. The patient's pain is not positional. This in conjunction with the lack of PR depressions and ST elevations on the ECG are reassuring against Pericarditis. The patient's non-elevated troponin and ECG are also inconsistent with Myocarditis.  The CXR is unremarkable for focal airspace disease.  The patient is afebrile and denies productive cough.  Therefore, I do not suspect Pneumonia. There is no evidence of  Pneumothorax on physical exam or on the CXR. CXR shows no evidence of Esophageal Tear and there is no recent intractable emesis or esophageal instrumentation. There is no peritonitis or free air on CXR worrisome for a Perforated Abdominal Viscous.  I do not think that the patient has a Pulmonary Embolism. The patient is PERC negative (age < 50, HR < 100, SpO2% > 95%, no unilateral leg swelling, no hemoptysis, no surgery or trauma requiring anesthesia within the past 4 weeks, no history of prior PE or DVT, and no hormone use).  The patient's pain is not described as tearing and it does not radiate to back. Pulses are present bilaterally in both the upper and lower extremities. CXR does not show a widened mediastinum. I have a very low suspicion for Aortic Dissection.  The patient was appropriately risk  stratified as HEAR score of 1.   I suspect that she most likely has GERD or gastritis.  However, before her work-up could be completed, she eloped from the department without telling myself or nursing staff.    Final Clinical Impressions(s) / ED Diagnoses   Final diagnoses:  Chest pain, unspecified type    ED Discharge Orders    None       Talitha GivensAshburn, Keyanah Kozicki, MD 03/07/18 1513    Cathren LaineSteinl, Kevin, MD 03/08/18 [phone]

## 2018-03-07 NOTE — ED Notes (Addendum)
PT got up and left with visitors without speaking to staff. ED secretary at main desk saw them walking out and alerted this nurse. PT was not in sight when this nurse pursued.

## 2018-03-07 NOTE — ED Triage Notes (Signed)
Pt states that for the past several months she has been having CP every night, worse over the past three nights, awakens her from sleep, some SOB and n/v

## 2019-06-08 ENCOUNTER — Other Ambulatory Visit: Payer: Self-pay

## 2019-06-08 DIAGNOSIS — Z20822 Contact with and (suspected) exposure to covid-19: Secondary | ICD-10-CM

## 2019-06-09 LAB — NOVEL CORONAVIRUS, NAA: SARS-CoV-2, NAA: NOT DETECTED

## 2020-02-07 IMAGING — DX DG CHEST 2V
2 series · 2 of 2 positions shown · non-contrast
Comparison: None.

CLINICAL DATA: Lower chest pain and dyspnea x1 month.  Smoker.

EXAM:
CHEST - 2 VIEW

[chest pa]
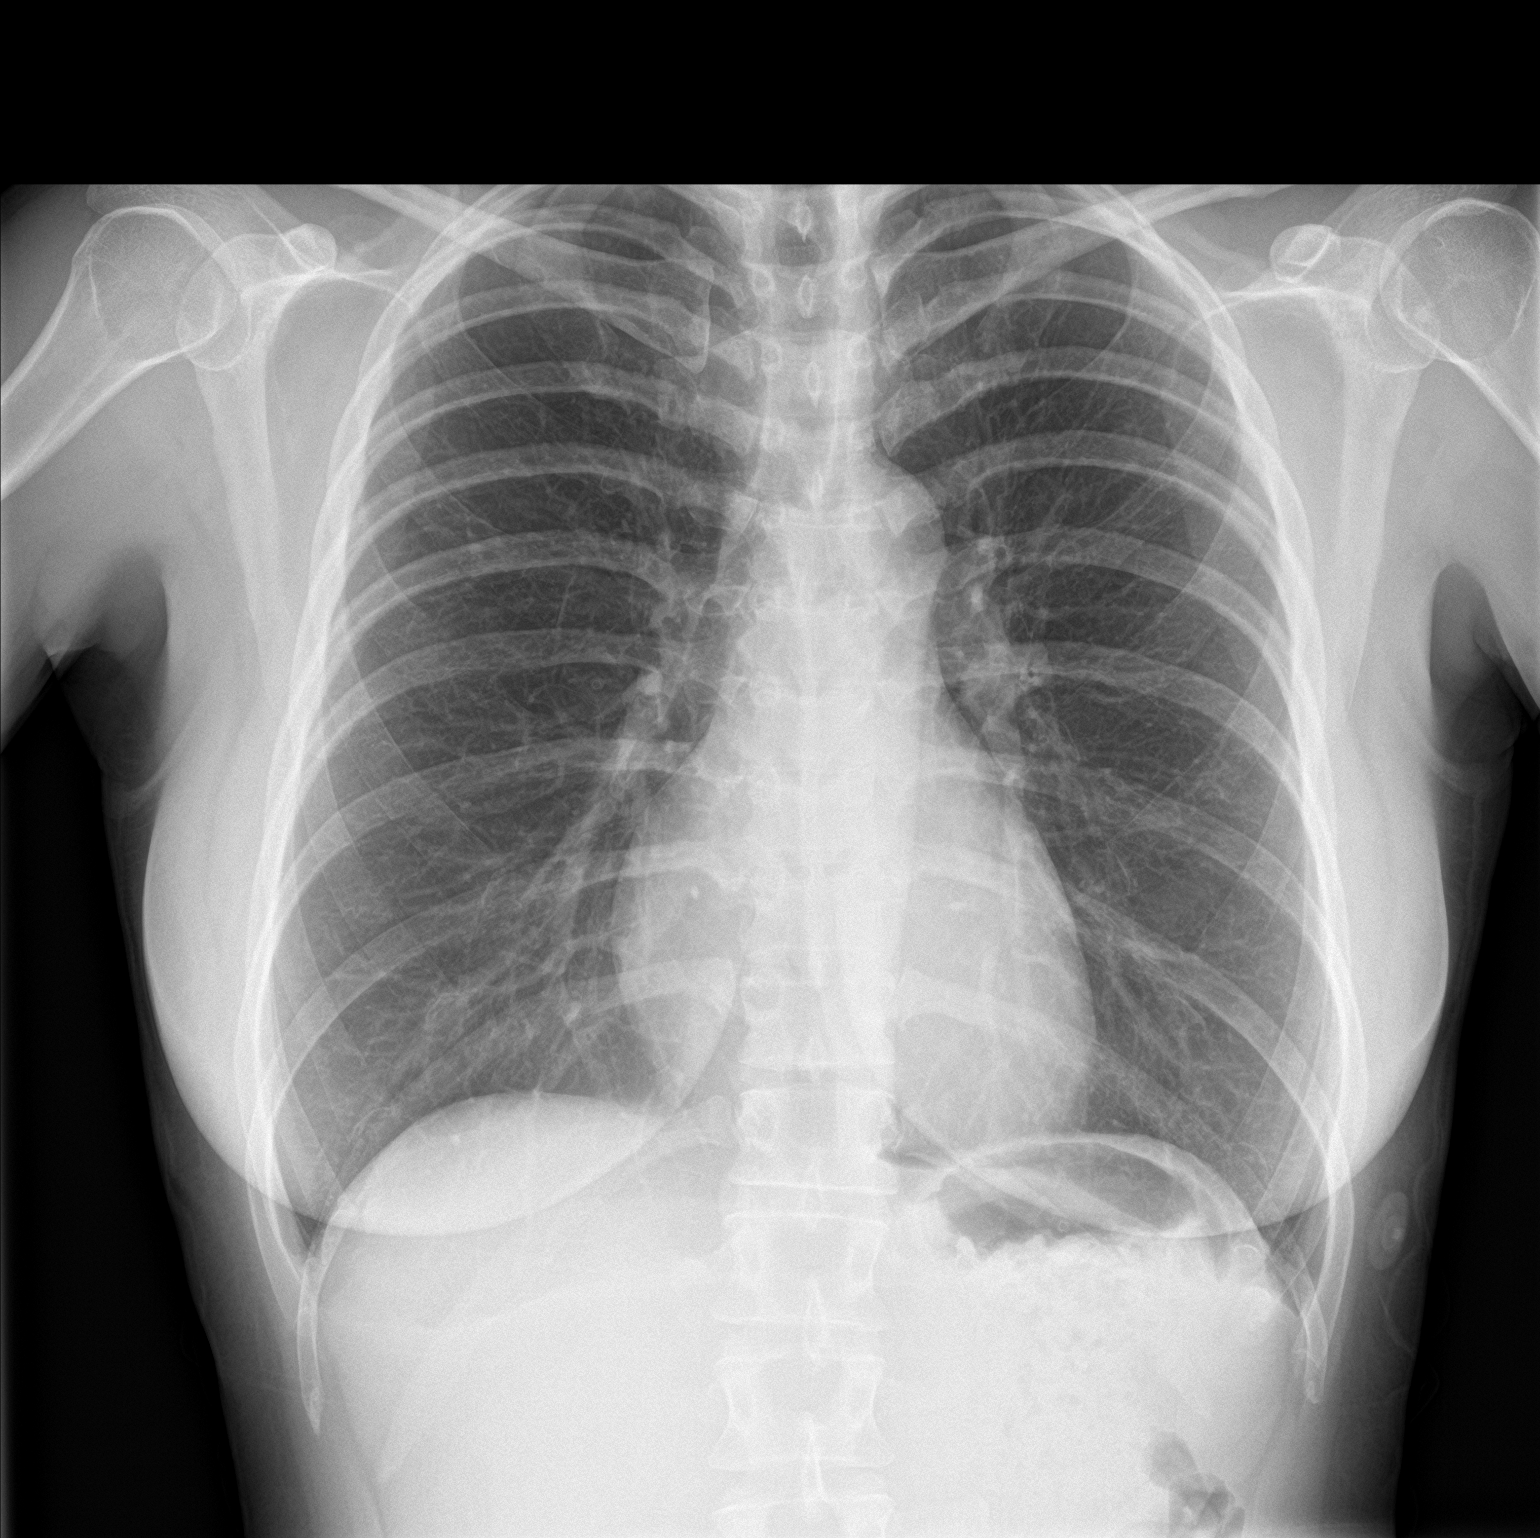

[chest lat]
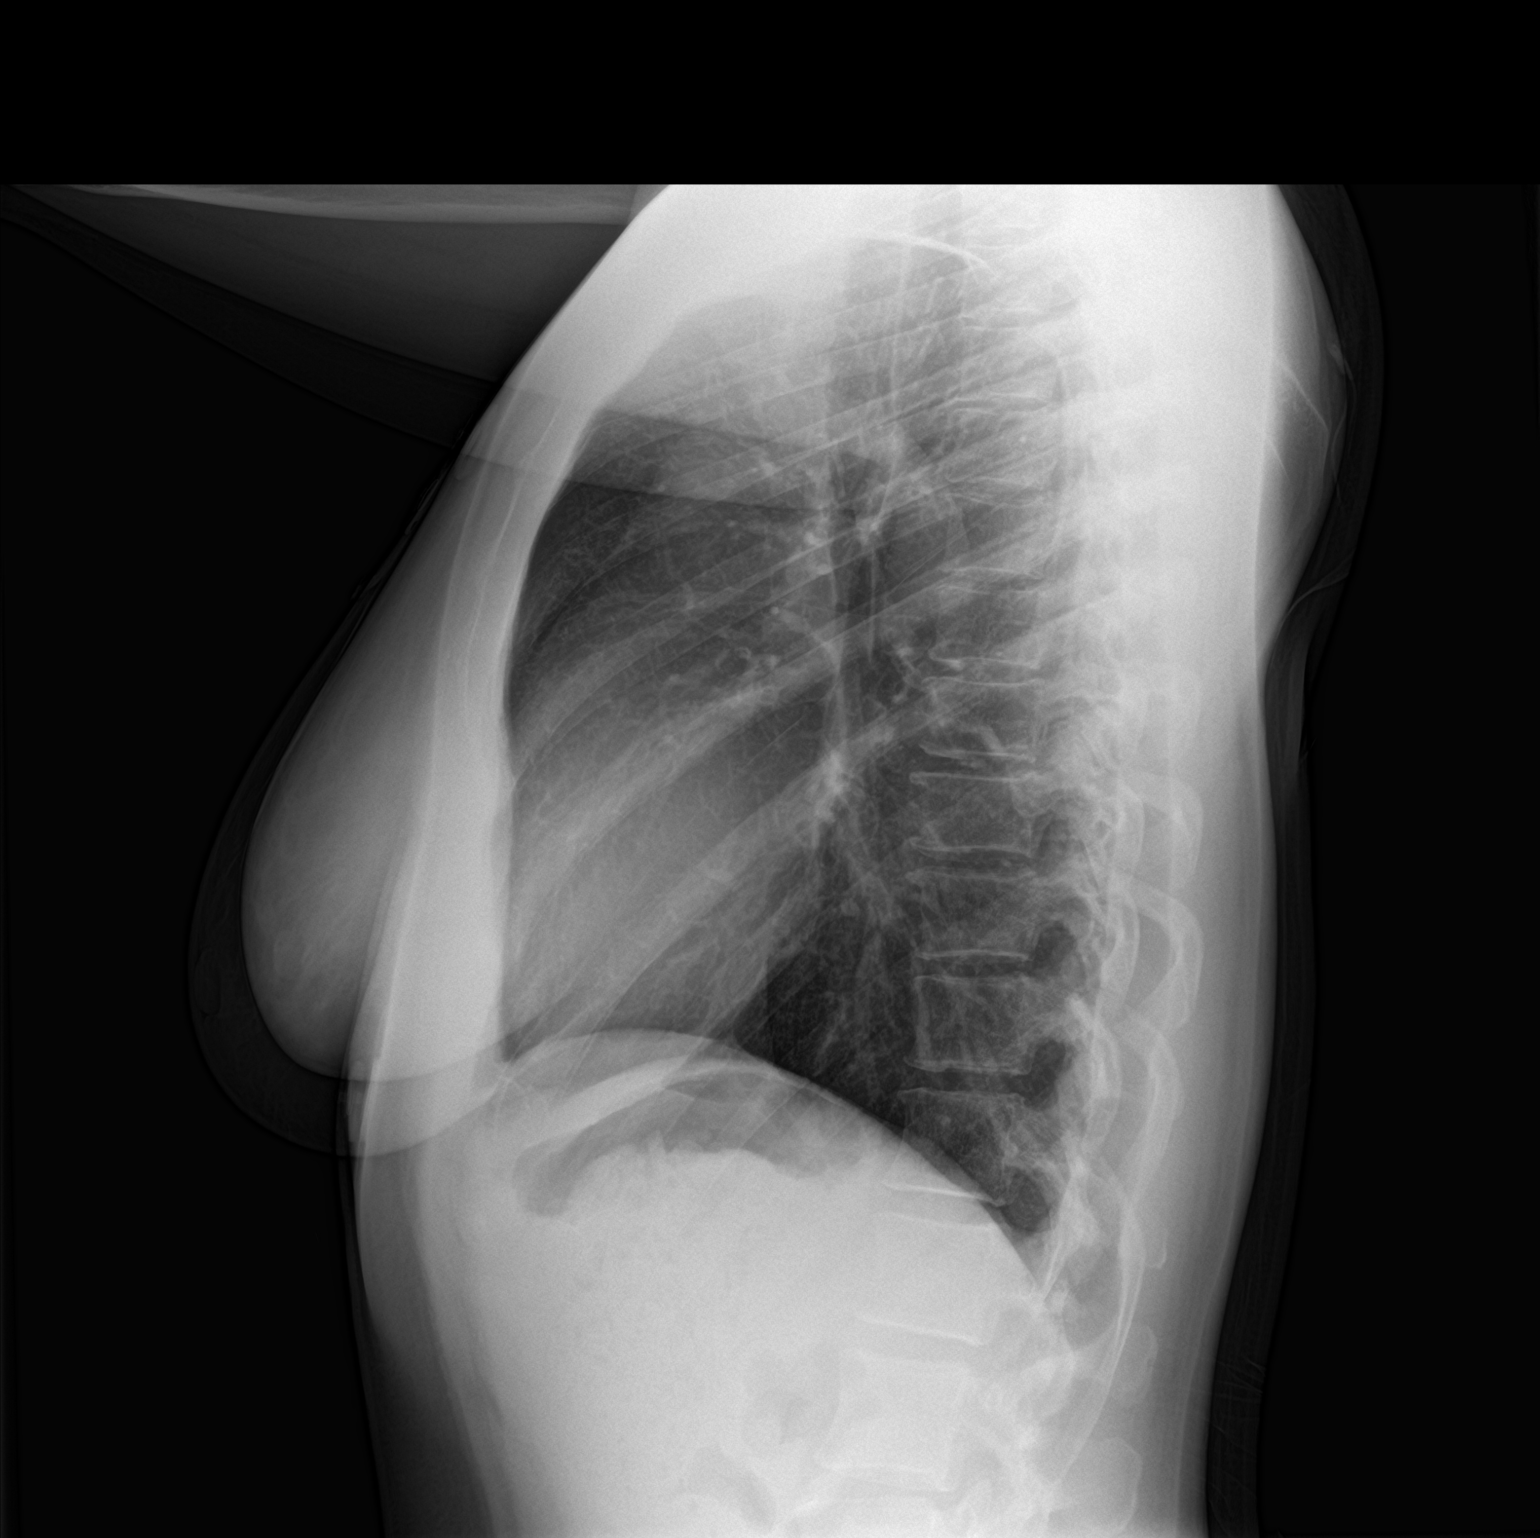

[2 of 2 positions shown; findings below may reference images not displayed]

FINDINGS: The heart size and mediastinal contours are within normal limits.
Both lungs are clear. The visualized skeletal structures are
unremarkable.
IMPRESSION: No active cardiopulmonary disease.

## 2024-04-17 ENCOUNTER — Other Ambulatory Visit: Payer: Self-pay | Admitting: Medical Genetics

## 2024-04-18 ENCOUNTER — Other Ambulatory Visit (HOSPITAL_COMMUNITY)
